# Patient Record
Sex: Female | Born: 2000 | Race: White | Hispanic: No | Marital: Single | Smoking: Never smoker
Health system: Southern US, Community
[De-identification: ages and names within clinical notes are randomized; demographics above are authoritative.]

## PROBLEM LIST (undated history)

## (undated) DIAGNOSIS — E282 Polycystic ovarian syndrome: Secondary | ICD-10-CM

## (undated) DIAGNOSIS — H50112 Monocular exotropia, left eye: Secondary | ICD-10-CM

## (undated) DIAGNOSIS — H5010 Unspecified exotropia: Secondary | ICD-10-CM

## (undated) DIAGNOSIS — I10 Essential (primary) hypertension: Secondary | ICD-10-CM

## (undated) HISTORY — DX: Unspecified exotropia: H50.10

## (undated) HISTORY — DX: Monocular exotropia, left eye: H50.112

## (undated) HISTORY — DX: Polycystic ovarian syndrome: E28.2

---
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
## 2022-06-14 ENCOUNTER — Ambulatory Visit: Payer: Medicaid Other | Admitting: Dermatology

## 2022-06-28 ENCOUNTER — Ambulatory Visit (INDEPENDENT_AMBULATORY_CARE_PROVIDER_SITE_OTHER): Payer: Medicaid Other | Admitting: Dermatology

## 2022-06-28 VITALS — BP 148/97 | HR 102

## 2022-06-28 DIAGNOSIS — Z79899 Other long term (current) drug therapy: Secondary | ICD-10-CM | POA: Diagnosis not present

## 2022-06-28 DIAGNOSIS — L700 Acne vulgaris: Secondary | ICD-10-CM

## 2022-06-28 DIAGNOSIS — R7989 Other specified abnormal findings of blood chemistry: Secondary | ICD-10-CM | POA: Diagnosis not present

## 2022-06-28 DIAGNOSIS — L810 Postinflammatory hyperpigmentation: Secondary | ICD-10-CM

## 2022-06-28 DIAGNOSIS — Z7189 Other specified counseling: Secondary | ICD-10-CM

## 2022-06-28 MED ORDER — ADAPALENE 0.3 % EX GEL: CUTANEOUS | 3 refills | Status: AC

## 2022-06-28 NOTE — Patient Instructions (Addendum)
Topical retinoid medications like tretinoin/Retin-A, adapalene/Differin, tazarotene/Fabior, and Epiduo/Epiduo Forte can cause dryness and irritation when first started. Only apply a pea-sized amount to the entire affected area. Avoid applying it around the eyes, edges of mouth and creases at the nose. If you experience irritation, use a good moisturizer first and/or apply the medicine less often. If you are doing well with the medicine, you can increase how often you use it until you are applying every night. Be careful with sun protection while using this medication as it can make you sensitive to the sun. This medicine should not be used by pregnant women.    Due to recent changes in healthcare laws, you may see results of your pathology and/or laboratory studies on MyChart before the doctors have had a chance to review them. We understand that in some cases there may be results that are confusing or concerning to you. Please understand that not all results are received at the same time and often the doctors may need to interpret multiple results in order to provide you with the best plan of care or course of treatment. Therefore, we ask that you please give [place] 2 business days to thoroughly review all your results before contacting the office for clarification. Should we see a critical lab result, you will be contacted sooner.   If You Need Anything After Your Visit  If you have any questions or concerns for your doctor, please call our main line at [phone] and press option 4 to reach your doctor's medical assistant. If no one answers, please leave a voicemail as directed and we will return your call as soon as possible. Messages left after 4 pm will be answered the following business day.   You may also send [place] a message via [place]. We typically respond to MyChart messages within 1-2 business days.  For prescription refills, please ask your pharmacy to contact our office. Our fax number is  [phone].  If you have an urgent issue when the clinic is closed that cannot wait until the next business day, you can page your doctor at the number below.    Please note that while we do our best to be available for urgent issues outside of office hours, we are not available 24/7.   If you have an urgent issue and are unable to reach [place], you may choose to seek medical care at your doctor's office, retail clinic, urgent care center, or emergency room.  If you have a medical emergency, please immediately call 911 or go to the emergency department.  Pager Numbers  - Dr. Nehemiah Massed: [phone]  - Dr. Laurence Ferrari: [phone]  - Dr. Nicole Kindred: [phone]  In the event of inclement weather, please call our main line at [phone] for an update on the status of any delays or closures.  Dermatology Medication Tips: Please keep the boxes that topical medications come in in order to help keep track of the instructions about where and how to use these. Pharmacies typically print the medication instructions only on the boxes and not directly on the medication tubes.   If your medication is too expensive, please contact our office at [phone] option 4 or send [place] a message through [place].   We are unable to tell what your co-pay for medications will be in advance as this is different depending on your insurance coverage. However, we may be able to find a substitute medication at lower cost or fill out paperwork to get insurance to cover a  needed medication.   If a prior authorization is required to get your medication covered by your insurance company, please allow [place] 1-2 business days to complete this process.  Drug prices often vary depending on where the prescription is filled and some pharmacies may offer cheaper prices.  The website www.goodrx.com contains coupons for medications through different pharmacies. The prices here do not account for what the cost may be with help from  insurance (it may be cheaper with your insurance), but the website can give you the price if you did not use any insurance.  - You can print the associated coupon and take it with your prescription to the pharmacy.  - You may also stop by our office during regular business hours and pick up a GoodRx coupon card.  - If you need your prescription sent electronically to a different pharmacy, notify our office through [facility] or by phone at [phone] option 4.     Si Usted Necesita Algo Despus de Su Visita  Tambin puede enviarnos un mensaje a travs de Pharmacist, community. Por lo general respondemos a los mensajes de MyChart en el transcurso de 1 a 2 das hbiles.  Para renovar recetas, por favor pida a su farmacia que se ponga en contacto con nuestra oficina. Harland Dingwall de fax es [place] [phone].  Si tiene un asunto urgente cuando la clnica est cerrada y que no puede esperar hasta el siguiente da hbil, puede llamar/localizar a su doctor(a) al nmero que aparece a continuacin.   Por favor, tenga en cuenta que aunque hacemos todo lo posible para estar disponibles para asuntos urgentes fuera del horario de [place], no estamos disponibles las 24 horas del da, los 7 das de la [place].   Si tiene un problema urgente y no puede comunicarse con nosotros, puede optar por buscar atencin mdica  en el consultorio de su doctor(a), en una clnica privada, en un centro de atencin urgente o en una sala de emergencias.  Si tiene Engineering geologist, por favor llame inmediatamente al 911 o vaya a la sala de emergencias.  Nmeros de bper  - Dr. Nehemiah Massed: [phone]  - Dra. Moye: [phone]  - Dra. Nicole Kindred: [phone]  En caso de inclemencias del [place], por favor llame a Johnsie Kindred principal al [phone] para una actualizacin sobre el [place] de cualquier retraso o cierre.  Consejos para la medicacin en dermatologa: Por favor, guarde las cajas en las que vienen los  medicamentos de uso tpico para ayudarle a seguir las instrucciones sobre dnde y cmo usarlos. Las farmacias generalmente imprimen las instrucciones del medicamento slo en las cajas y no directamente en los tubos del [place].   Si su medicamento es muy caro, por favor, pngase en contacto con Zigmund Daniel llamando al [phone] y presione la opcin 4 o envenos un mensaje a travs de Pharmacist, community.   No podemos decirle cul ser su copago por los medicamentos por adelantado ya que esto es diferente dependiendo de la cobertura de su seguro. Sin embargo, es posible que podamos encontrar un medicamento sustituto a Electrical engineer un formulario para que el seguro cubra el medicamento que se considera necesario.   Si se requiere una autorizacin previa para que su compaa de seguros [place] su medicamento, por favor permtanos de 1 a 2 das hbiles para completar este proceso.  Los precios de los medicamentos varan con frecuencia dependiendo del Environmental consultant de dnde se surte la receta y alguna farmacias pueden ofrecer precios ms baratos.  El sitio web www.goodrx.com tiene cupones para medicamentos de Airline pilot. Los precios aqu no tienen en cuenta lo que podra costar con la ayuda del seguro (puede ser ms barato con su seguro), pero el sitio web puede darle el precio si no utiliz Research scientist (physical sciences).  - Puede imprimir el cupn correspondiente y llevarlo con su receta a la farmacia.  - Tambin puede pasar por nuestra oficina durante el horario de atencin regular y Charity fundraiser una tarjeta de cupones de GoodRx.  - Si necesita que su receta se enve electrnicamente a una farmacia diferente, informe a nuestra oficina a travs de MyChart de [place] o por telfono llamando al [phone] y presione la opcin 4.

## 2022-06-28 NOTE — Progress Notes (Signed)
   New Patient Visit  Subjective  Connie Stewart is a 22 y.o. female who presents for the following: Rash (Face, back, and legs - patient was referred for a rash in July/August, but she feels it was more acne like than a rash. She has a hx of elevated liver enzymes which is when outbreak occurred at its worse).  The following portions of the chart were reviewed this encounter and updated as appropriate:   Tobacco  Allergies  Meds  Problems  Med Hx  Surg Hx  Fam Hx     Review of Systems:  No other skin or systemic complaints except as noted in HPI or Assessment and Plan.  Objective  Well appearing patient in no apparent distress; mood and affect are within normal limits.  A focused examination was performed including the face, chest, back, and legs. Relevant physical exam findings are noted in the Assessment and Plan.  Face, chest, back Fine comedones and scattered few papules on the face, crusting and spotty hyperpigmentation on the back and shoulders, several active papules on the shoulders.  Popliteal areas Spotty hyperpigmentation on the popliteal areas.   Assessment & Plan  Acne vulgaris Face, chest, back Hx abnormal liver enzymes, pt states normal levels now -  Start Differin 0.3% gel QHS to the face, shoulders, and back.  Consider Doxycycline 50 mg po QHS with evening meal.  Advised patient that we will need her most recent lab results before prescribing the Doxycycline. Patient signed medical record release form for lab results, and once they have been reviewed we will send in Doxycycline 50 mg po QHS.   Adapalene (DIFFERIN) 0.3 % gel - Face, chest, back Apply a thin coat to the face, chest, and back QHS.  Post-inflammatory hyperpigmentation Popliteal areas No active lesions today, hyperpigmentation will fade on its own. No tx needed. Benign-appearing.  Observation.  Call clinic for new or changing lesions.  Recommend daily use of broad spectrum spf 30+ sunscreen to  sun-exposed areas.   History of abnormal Liver Enzymes See above  Return in about 3 months (around 09/26/2022) for acne follow up.  Luther Redo, CMA, am acting as scribe for Sarina Ser, MD . Documentation: I have reviewed the above documentation for accuracy and completeness, and I agree with the above.  Sarina Ser, MD

## 2022-07-06 ENCOUNTER — Encounter: Payer: Self-pay | Admitting: Dermatology

## 2022-09-30 ENCOUNTER — Ambulatory Visit: Payer: Medicaid Other | Admitting: Dermatology

## 2023-05-12 ENCOUNTER — Other Ambulatory Visit: Payer: Self-pay

## 2023-05-12 ENCOUNTER — Emergency Department (HOSPITAL_COMMUNITY)
Admission: EM | Admit: 2023-05-12 | Discharge: 2023-05-12 | Disposition: A | Discharge status: Home / Self Care | Payer: No Typology Code available for payment source | Attending: Emergency Medicine | Admitting: Emergency Medicine

## 2023-05-12 ENCOUNTER — Emergency Department (HOSPITAL_COMMUNITY): Payer: No Typology Code available for payment source

## 2023-05-12 ENCOUNTER — Encounter (HOSPITAL_COMMUNITY): Payer: Self-pay

## 2023-05-12 DIAGNOSIS — S060X1A Concussion with loss of consciousness of 30 minutes or less, initial encounter: Secondary | ICD-10-CM | POA: Insufficient documentation

## 2023-05-12 DIAGNOSIS — S0990XA Unspecified injury of head, initial encounter: Secondary | ICD-10-CM | POA: Diagnosis present

## 2023-05-12 DIAGNOSIS — Y92410 Unspecified street and highway as the place of occurrence of the external cause: Secondary | ICD-10-CM | POA: Insufficient documentation

## 2023-05-12 LAB — POC URINE PREG, ED: Preg Test, Ur: NEGATIVE

## 2023-05-12 MED ORDER — METHOCARBAMOL 500 MG PO TABS: 500.0000 mg | ORAL_TABLET | Freq: Four times a day (QID) | ORAL | 0 refills | Status: AC | PRN

## 2023-05-12 MED ORDER — IBUPROFEN 400 MG PO TABS
600.0000 mg | ORAL_TABLET | Freq: Once | ORAL | Status: AC
  Administered 2023-05-12: 600 mg via ORAL
  Filled 2023-05-12: qty 2

## 2023-05-12 MED ORDER — ACETAMINOPHEN 500 MG PO TABS
1000.0000 mg | ORAL_TABLET | Freq: Once | ORAL | Status: AC
  Administered 2023-05-12: 1000 mg via ORAL
  Filled 2023-05-12: qty 2

## 2023-05-12 MED ORDER — NAPROXEN 500 MG PO TABS
500.0000 mg | ORAL_TABLET | Freq: Two times a day (BID) | ORAL | 0 refills | Status: AC
Start: 2023-05-12 — End: ?
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
## 2024-03-13 IMAGING — US US ABDOMEN LIMITED
1 series · 14 of 25 positions shown · non-contrast
Comparison: None

CLINICAL DATA: Jaundice for 1 week, hypertension

EXAM:
ULTRASOUND ABDOMEN LIMITED RIGHT UPPER QUADRANT

[Series 1: us abdomen limited ruq (liver/gb) · 14 of 110 slices shown]
[im 1/110]
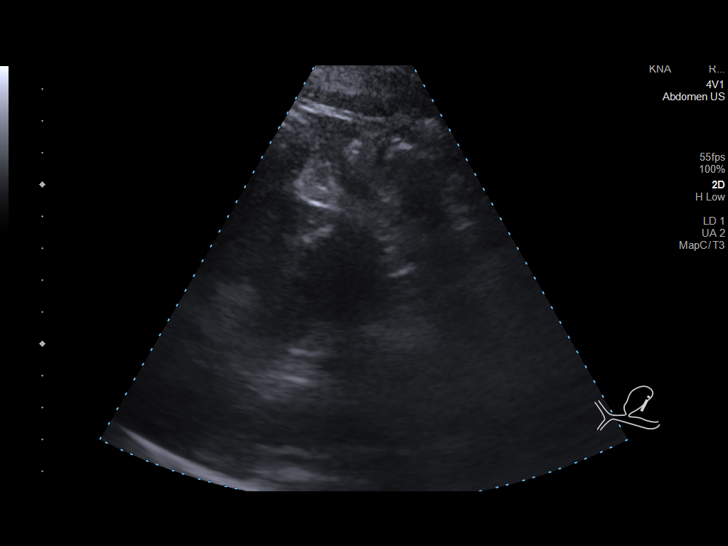
[im 10/110]
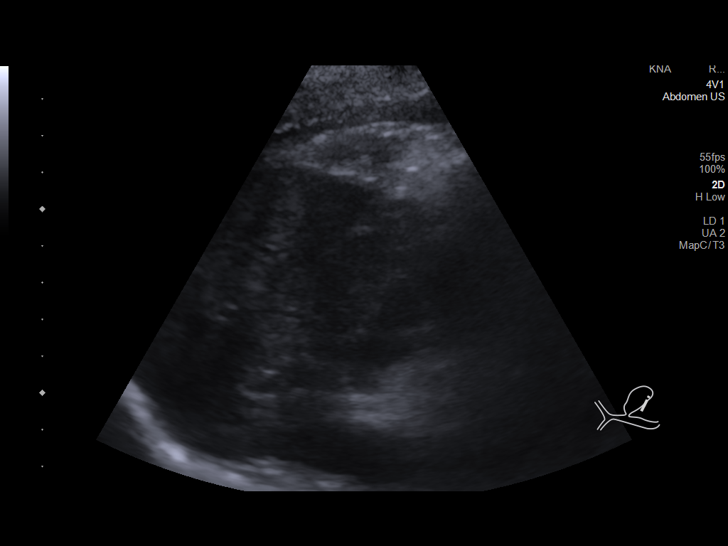
[im 19/110]
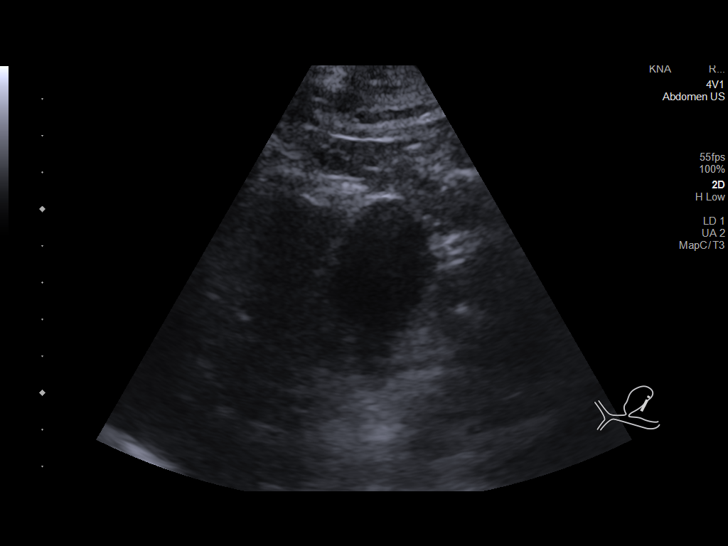
[im 28/110]
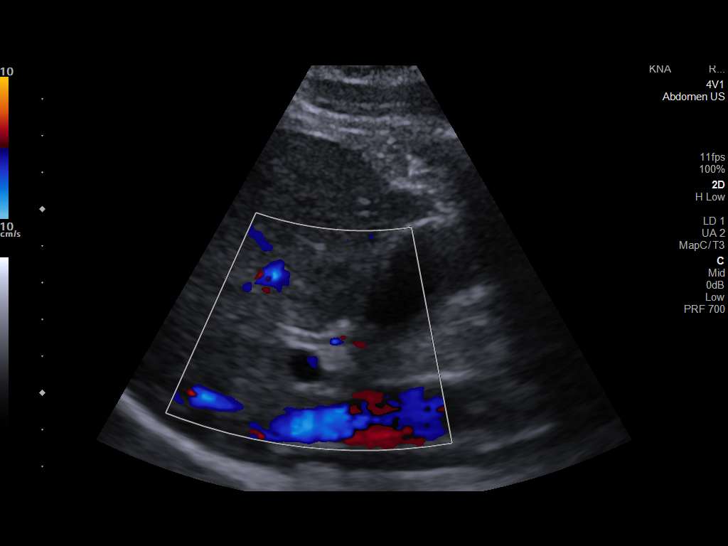
[im 37/110]
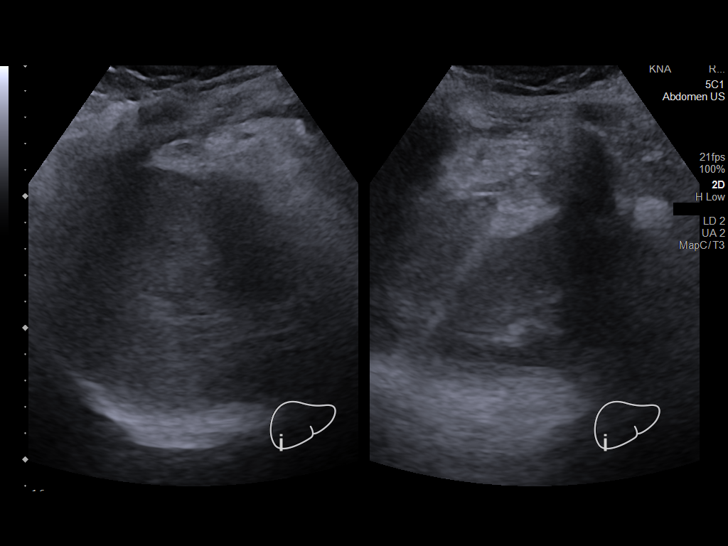
[im 41/110]
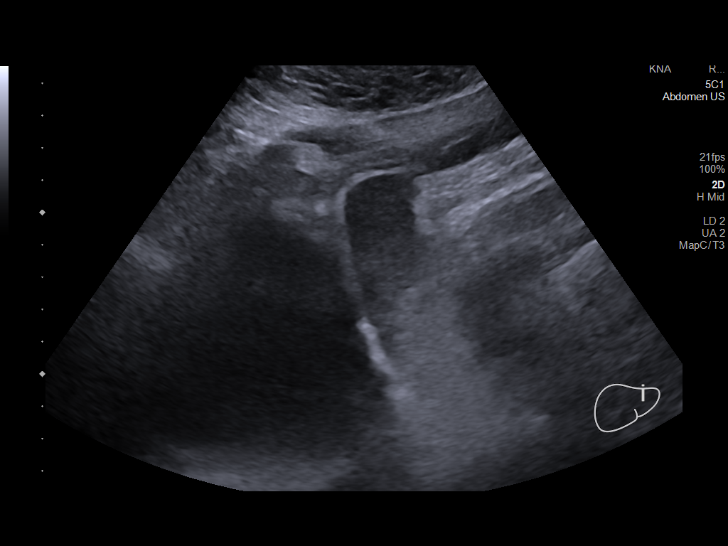
[im 50/110]
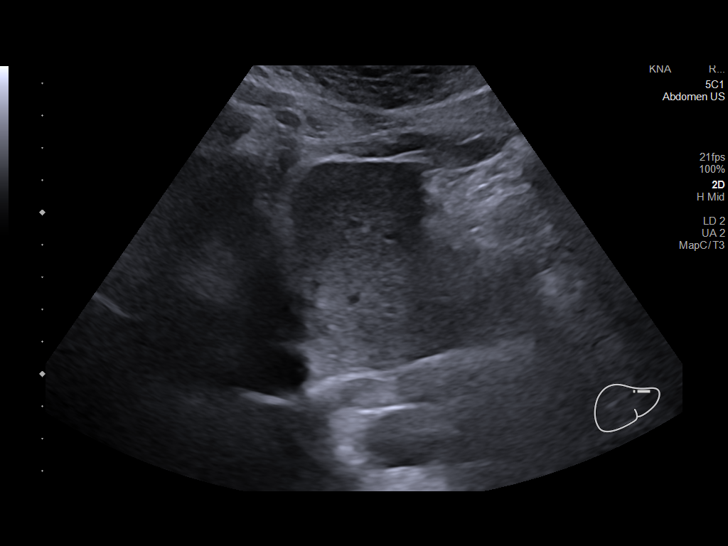
[im 60/110]
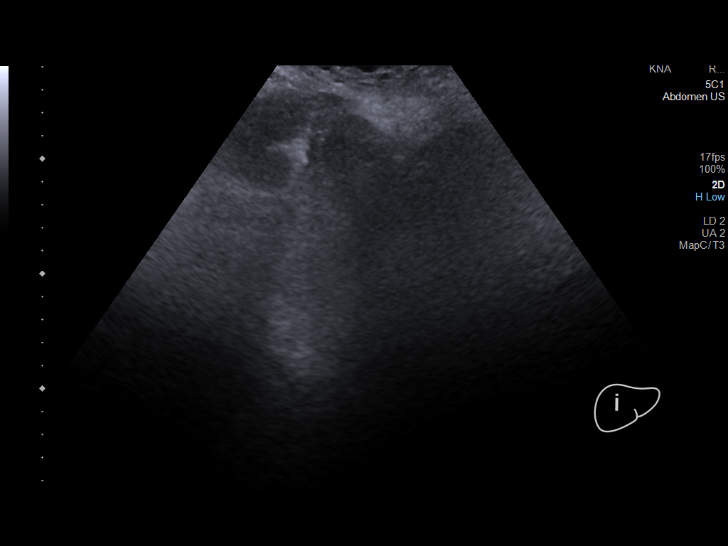
[im 69/110]
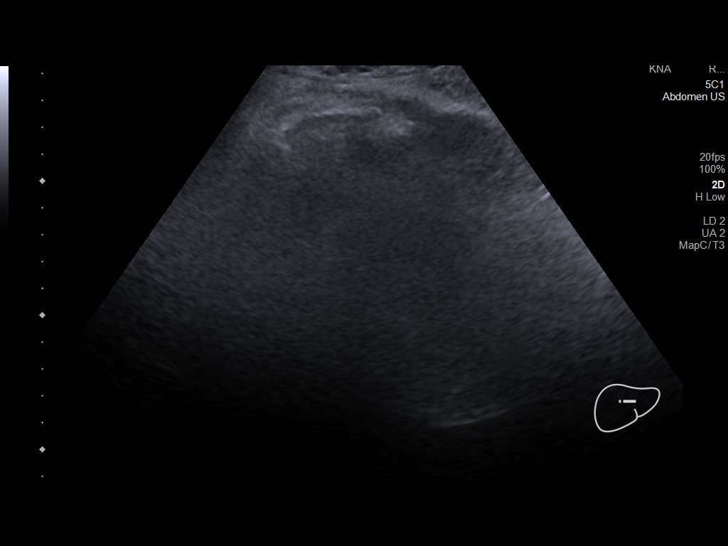
[im 73/110]
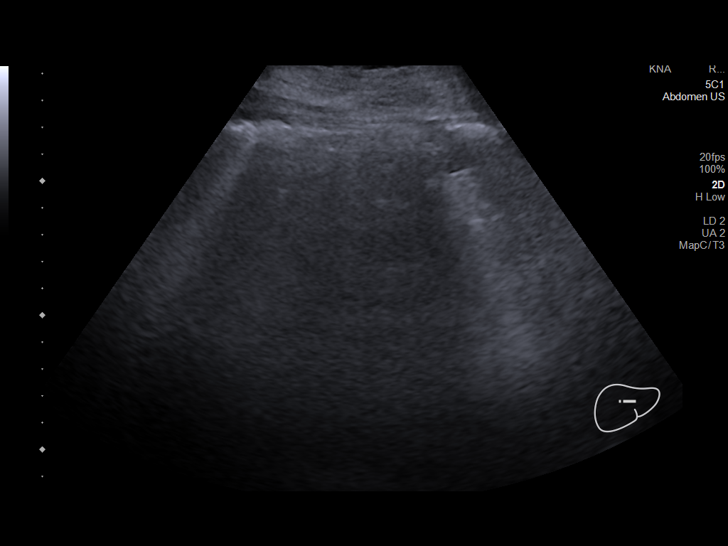
[im 82/110]
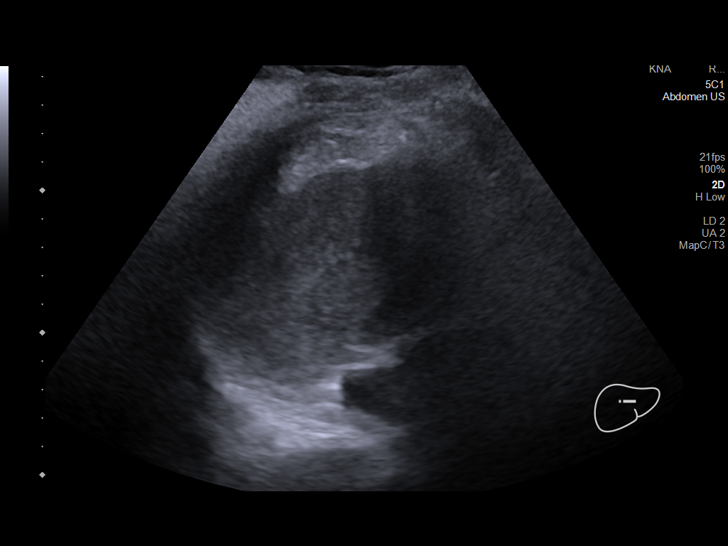
[im 91/110]
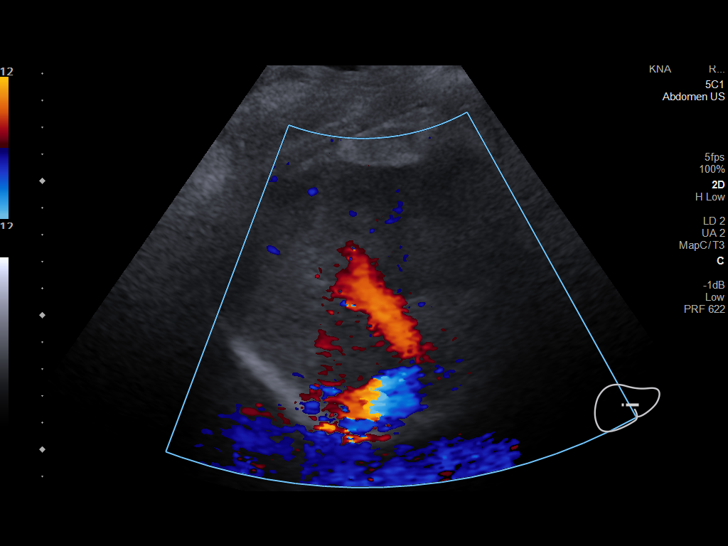
[im 100/110]
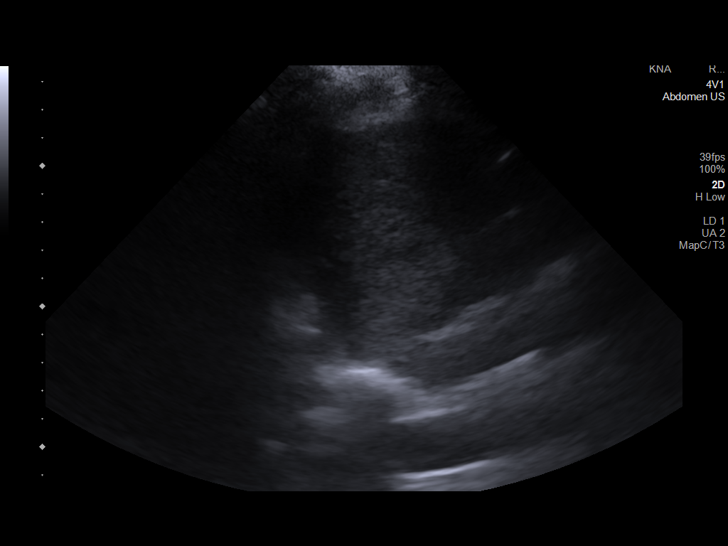
[im 110/110]
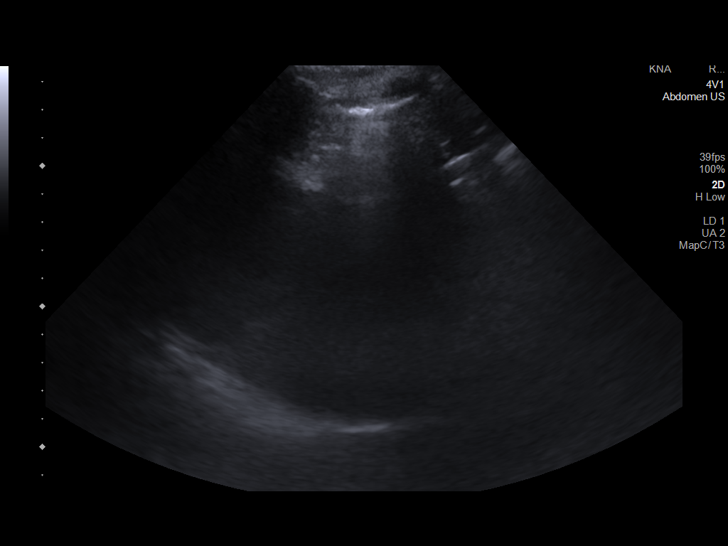

[14 of 25 positions shown; findings below may reference images not displayed]

FINDINGS: Gallbladder:

Normally distended without stones or wall thickening. No
pericholecystic fluid or sonographic Murphy sign.

Common bile duct:

Diameter: 2 mm, normal

Liver:

Shadowing from bowel gas. Slightly coarsened and mildly increased
parenchymal echogenicity, favor fatty infiltration. No definite
nodularity or intrahepatic biliary dilatation. No discrete mass.
Portal vein is patent on color Doppler imaging with normal direction
of blood flow towards the liver.

Other: No RIGHT upper quadrant free fluid.
IMPRESSION: Probable fatty infiltration of liver.

Otherwise negative exam.
